# Patient Record
Sex: Male | Born: 2000 | Hispanic: Refuse to answer | Marital: Single | Smoking: Never smoker
Health system: Southern US, Community
[De-identification: ages and names within clinical notes are randomized; demographics above are authoritative.]

---
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
## 2019-05-03 ENCOUNTER — Emergency Department
Admission: EM | Admit: 2019-05-03 | Discharge: 2019-05-03 | Disposition: A | Discharge status: Home / Self Care | Payer: 59 | Attending: Emergency Medicine | Admitting: Emergency Medicine

## 2019-05-03 ENCOUNTER — Emergency Department: Payer: 59

## 2019-05-03 ENCOUNTER — Other Ambulatory Visit: Payer: Self-pay

## 2019-05-03 DIAGNOSIS — Y999 Unspecified external cause status: Secondary | ICD-10-CM | POA: Insufficient documentation

## 2019-05-03 DIAGNOSIS — S0990XA Unspecified injury of head, initial encounter: Secondary | ICD-10-CM | POA: Insufficient documentation

## 2019-05-03 DIAGNOSIS — W01198A Fall on same level from slipping, tripping and stumbling with subsequent striking against other object, initial encounter: Secondary | ICD-10-CM | POA: Diagnosis not present

## 2019-05-03 DIAGNOSIS — Y929 Unspecified place or not applicable: Secondary | ICD-10-CM | POA: Diagnosis not present

## 2019-05-03 DIAGNOSIS — Y9372 Activity, wrestling: Secondary | ICD-10-CM | POA: Diagnosis not present

## 2019-05-03 DIAGNOSIS — G44301 Post-traumatic headache, unspecified, intractable: Secondary | ICD-10-CM | POA: Insufficient documentation

## 2019-05-03 MED ORDER — EXCEDRIN MIGRAINE 250-250-65 MG PO TABS: 1.0000 | ORAL_TABLET | Freq: Four times a day (QID) | ORAL | 0 refills | Status: AC | PRN

## 2019-05-03 NOTE — ED Provider Notes (Signed)
[facility] Emergency Department Provider Note  ____________________________________________  Time seen: Approximately 5:51 PM  I have reviewed the triage vital signs and the nursing notes.   HISTORY  Chief Complaint Headache    HPI Timothy Morales is a 19 y.o. male who presents the emergency department complaining of posttraumatic headache, feeling dizzy.  Patient states that he was wrestling with some friends, calling me to the side of the head, then 1 to the forehead causing him to fall backwards and hitting his head on the floor.  No loss of consciousness but his patient states that he felt very dazed after the incident.  He reports that his friends state that he was confused, not making much sense for the first minute or 2 after head injury.  Since then patient feels like he is improved, however he does feel slightly off balance, slightly woozy, and does endorse a right temporal and frontal headache.  No vision changes.  No neck pain.  No chest pain, shortness of breath.  No nausea or vomiting.         History reviewed. No pertinent past medical history.  There are no problems to display for this patient.   History reviewed. No pertinent surgical history.  Prior to Admission medications   Medication Sig Start Date End Date Taking? Authorizing Provider  aspirin-acetaminophen-caffeine (EXCEDRIN MIGRAINE) [id number] MG tablet Take 1 tablet by mouth every 6 (six) hours as needed for headache. 05/03/19   Sidonie Dexheimer, Delorise Royals, PA-C    Allergies Patient has no allergy information on record.  No family history on file.  Social History Social History   Tobacco Use  . Smoking status: Never Smoker  . Smokeless tobacco: Never Used  Substance Use Topics  . Alcohol use: Yes    Comment: social  . Drug use: Never     Review of Systems  Constitutional: No fever/chills Eyes: No visual changes. No discharge ENT: No upper respiratory  complaints. Cardiovascular: no chest pain. Respiratory: no cough. No SOB. Gastrointestinal: No abdominal pain.  No nausea, no vomiting.  No diarrhea.  No constipation. Musculoskeletal: Negative for musculoskeletal pain. Skin: Negative for rash, abrasions, lacerations, ecchymosis. Neurological: Head injury with headache to the right temporal and frontal region.  Denies focal weakness or numbness. 10-point ROS otherwise negative.  ____________________________________________   PHYSICAL EXAM:  VITAL SIGNS: ED Triage Vitals  Enc Vitals Group     BP 05/03/19 1731 (!) 149/79     Pulse Rate 05/03/19 1731 90     Resp 05/03/19 1731 16     Temp 05/03/19 1731 98.6 F (37 C)     Temp Source 05/03/19 1731 Oral     SpO2 05/03/19 1731 99 %     Weight 05/03/19 1734 160 lb (72.6 kg)     Height 05/03/19 1734 5\' 9"  (1.753 m)     Head Circumference --      Peak Flow --      Pain Score 05/03/19 1733 5     Pain Loc --      Pain Edu? --      Excl. in GC? --      Constitutional: Alert and oriented. Well appearing and in no acute distress. Eyes: Conjunctivae are normal. PERRL. EOMI. Head: No visible signs of trauma with ecchymosis, edema, abrasions or lacerations.  Patient is slightly tender to palpation in the right temporal region extending into the frontal skull.  No other tenderness to palpation over the osseous structures of the skull  or face.  No battle signs, raccoon eyes, serosanguineous fluid drainage from the ears or nares. ENT:      Ears:       Nose: No congestion/rhinnorhea.      Mouth/Throat: Mucous membranes are moist.  Neck: No stridor.  No cervical spine tenderness to palpation.  Cardiovascular: Normal rate, regular rhythm. Normal S1 and S2.  Good peripheral circulation. Respiratory: Normal respiratory effort without tachypnea or retractions. Lungs CTAB. Good air entry to the bases with no decreased or absent breath sounds. Musculoskeletal: Full range of motion to all extremities.  No gross deformities appreciated. Neurologic:  Normal speech and language. No gross focal neurologic deficits are appreciated.  Cranial nerves II through XII grossly intact.  Negative Romberg's and pronator drift. Skin:  Skin is warm, dry and intact. No rash noted. Psychiatric: Mood and affect are normal. Speech and behavior are normal. Patient exhibits appropriate insight and judgement.   ____________________________________________   LABS (all labs ordered are listed, but only abnormal results are displayed)  Labs Reviewed - No data to display ____________________________________________  EKG   ____________________________________________  RADIOLOGY I personally viewed and evaluated these images as part of my medical decision making, as well as reviewing the written report by the radiologist.  I concur with radiologist finding of no acute intracranial or osseous abnormality of about the head or neck.  CT Head Wo Contrast  Result Date: 05/03/2019 CLINICAL DATA:  Acute pain due to trauma. Pain in the right temporal region. EXAM: CT HEAD WITHOUT CONTRAST CT CERVICAL SPINE WITHOUT CONTRAST TECHNIQUE: Multidetector CT imaging of the head and cervical spine was performed following the standard protocol without intravenous contrast. Multiplanar CT image reconstructions of the cervical spine were also generated. COMPARISON:  None. FINDINGS: CT HEAD FINDINGS Brain: No evidence of acute infarction, hemorrhage, hydrocephalus, extra-axial collection or mass lesion/mass effect. Vascular: No hyperdense vessel or unexpected calcification. Skull: Normal. Negative for fracture or focal lesion. Sinuses/Orbits: There is some mucosal thickening of the ethmoid air cells. Other: None. CT CERVICAL SPINE FINDINGS Alignment: Normal. Skull base and vertebrae: No acute fracture. No primary bone lesion or focal pathologic process. Soft tissues and spinal canal: No prevertebral fluid or swelling. No visible canal  hematoma. Disc levels: The disc heights are relatively well preserved throughout. Upper chest: Negative. Other: IMPRESSION: 1. No acute intracranial abnormality. 2. No acute fracture or malalignment of the cervical spine. 3. Mucosal thickening of the ethmoid air cells. Electronically Signed   By: Constance Holster M.D.   On: 05/03/2019 19:01   CT Cervical Spine Wo Contrast  Result Date: 05/03/2019 CLINICAL DATA:  Acute pain due to trauma. Pain in the right temporal region. EXAM: CT HEAD WITHOUT CONTRAST CT CERVICAL SPINE WITHOUT CONTRAST TECHNIQUE: Multidetector CT imaging of the head and cervical spine was performed following the standard protocol without intravenous contrast. Multiplanar CT image reconstructions of the cervical spine were also generated. COMPARISON:  None. FINDINGS: CT HEAD FINDINGS Brain: No evidence of acute infarction, hemorrhage, hydrocephalus, extra-axial collection or mass lesion/mass effect. Vascular: No hyperdense vessel or unexpected calcification. Skull: Normal. Negative for fracture or focal lesion. Sinuses/Orbits: There is some mucosal thickening of the ethmoid air cells. Other: None. CT CERVICAL SPINE FINDINGS Alignment: Normal. Skull base and vertebrae: No acute fracture. No primary bone lesion or focal pathologic process. Soft tissues and spinal canal: No prevertebral fluid or swelling. No visible canal hematoma. Disc levels: The disc heights are relatively well preserved throughout. Upper chest: Negative. Other: IMPRESSION: 1. No  acute intracranial abnormality. 2. No acute fracture or malalignment of the cervical spine. 3. Mucosal thickening of the ethmoid air cells. Electronically Signed   By: Katherine Mantle M.D.   On: 05/03/2019 19:01    ____________________________________________    PROCEDURES  Procedure(s) performed:    Procedures    Medications - No data to display   ____________________________________________   INITIAL IMPRESSION / ASSESSMENT  AND PLAN / ED COURSE  Pertinent labs & imaging results that were available during my care of the patient were reviewed by me and considered in my medical decision making (see chart for details).  Review of the [place] CSRS was performed in accordance of the [facility] prior to dispensing any controlled drugs.           Patient's diagnosis is consistent with head injury.  Patient presented to emergency department after wrestling with some friends, being kneed in the head twice.  Patient did not lose consciousness.  Overall exam was reassuring with patient being neurologically intact.  Giving location, multiple impacts to the head, the fact the patient was having headache and dizziness imaging was pursued.  CT scans revealed no acute intracranial osseous abnormality.  I have discussed concussion symptoms with the patient.  At this time I feel that symptoms are most likely post traumatic headache versus true concussion but this will be further determined by ongoing symptoms, recurrent headaches, memory issues.  If patient does experience the symptoms follow-up with neurology.  Otherwise patient may manage any headache with prescription for Excedrin..  I discussed postconcussive symptoms, recommendations.  As long as symptoms improve patient may return to activity as tolerated.  Otherwise the patient is still symptomatic he should refrain from any chance of repeat head injury.  Follow-up with primary care as needed.  Patient is given ED precautions to return to the ED for any worsening or new symptoms.     ____________________________________________  FINAL CLINICAL IMPRESSION(S) / ED DIAGNOSES  Final diagnoses:  Injury of head, initial encounter      NEW MEDICATIONS STARTED DURING THIS VISIT:  ED Discharge Orders         Ordered    aspirin-acetaminophen-caffeine (EXCEDRIN MIGRAINE) 250-250-65 MG tablet  Every 6 hours PRN     05/03/19 1949              This chart was dictated using voice  recognition software/Dragon. Despite best efforts to proofread, errors can occur which can change the meaning. Any change was purely unintentional.    Racheal Patches, PA-C 05/03/19 1951    Sharman Cheek, MD 05/04/19 0005

## 2019-05-03 NOTE — ED Notes (Signed)
See triage note  Presents with headache  States he was wrestling and was kneed in right temporal area   And then again in frontal area  No LOC

## 2019-05-03 NOTE — ED Triage Notes (Signed)
Pt to the er for possible concussion. Pt states he was wrestling and caught a knee to the right temple and then one to the forehead. Pt states he never lost consciousness but did feel dizzy and took him a few minutes to return to normal. Pt is no distress at this time. Pt reports a headache.

## 2020-12-03 IMAGING — CT CT CERVICAL SPINE W/O CM
3 of 4 series · 9 of 33 positions shown, 11 images · non-contrast
Comparison: None.

CLINICAL DATA: Acute pain due to trauma. Pain in the right temporal
region.

EXAM:
CT HEAD WITHOUT CONTRAST
CT CERVICAL SPINE WITHOUT CONTRAST
TECHNIQUE: Multidetector CT imaging of the head and cervical spine was
performed following the standard protocol without intravenous
contrast. Multiplanar CT image reconstructions of the cervical spine
were also generated.

[Series 6: sagittal bone · sagittal · 0.22mm/px · 5 of 57 slices shown, 6 images]
[im 19/57  bone]
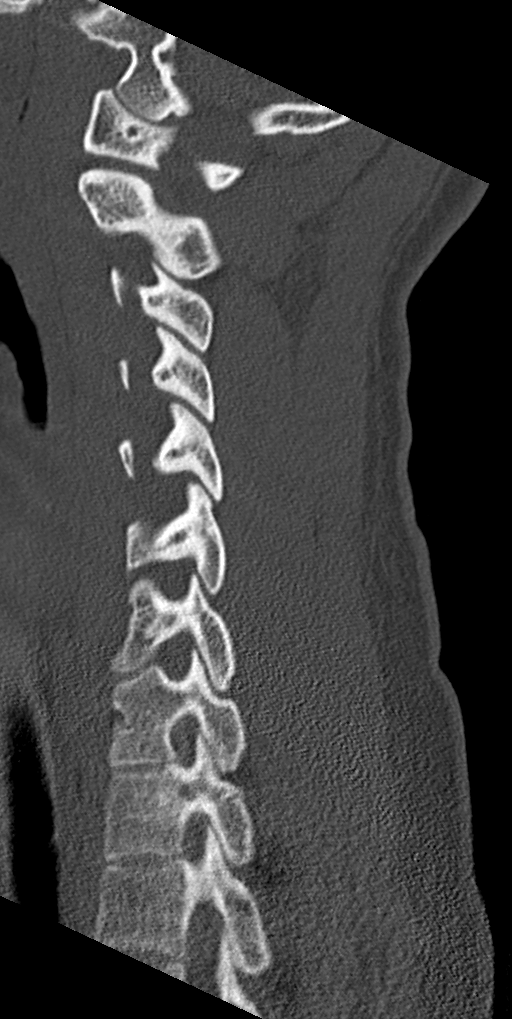
[im 24/57  bone]
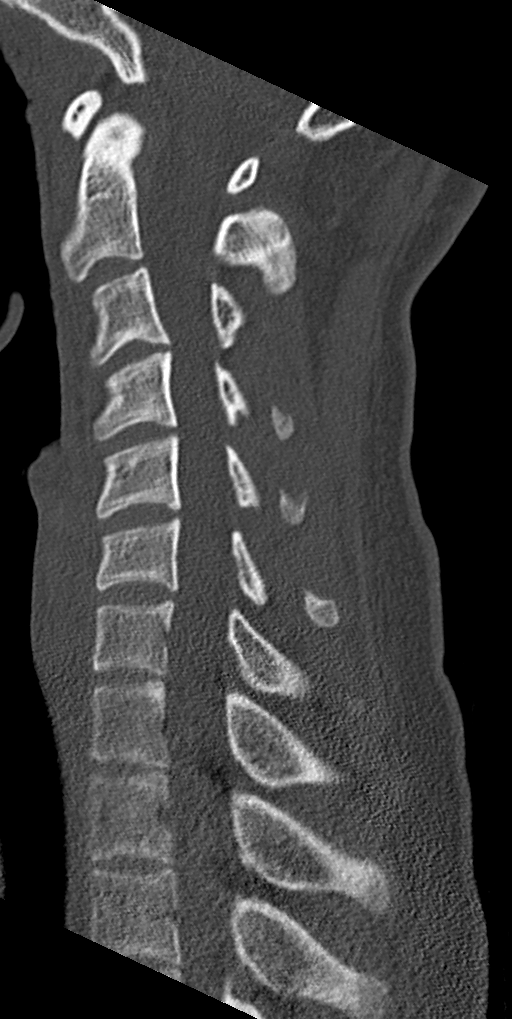
[im 29/57  soft-tissue]
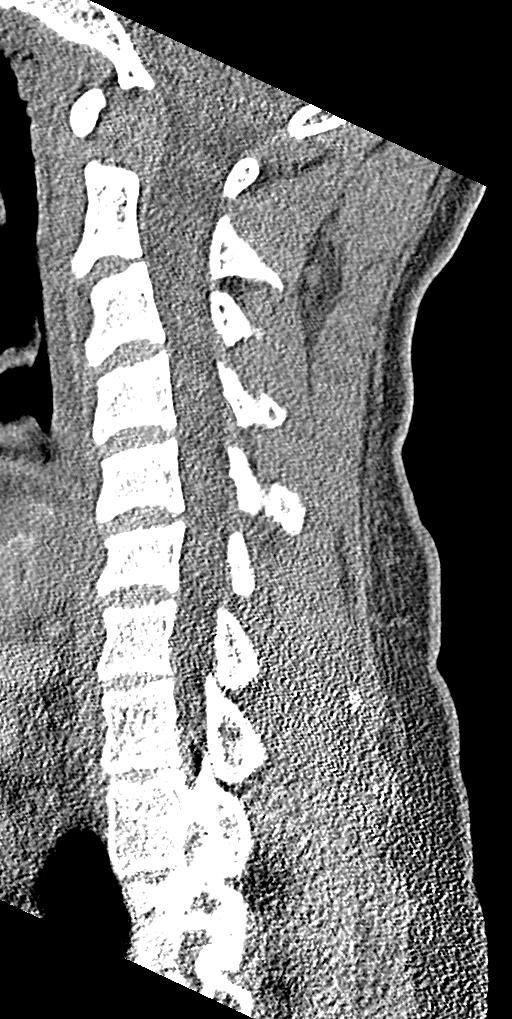
[im 29/57  bone]
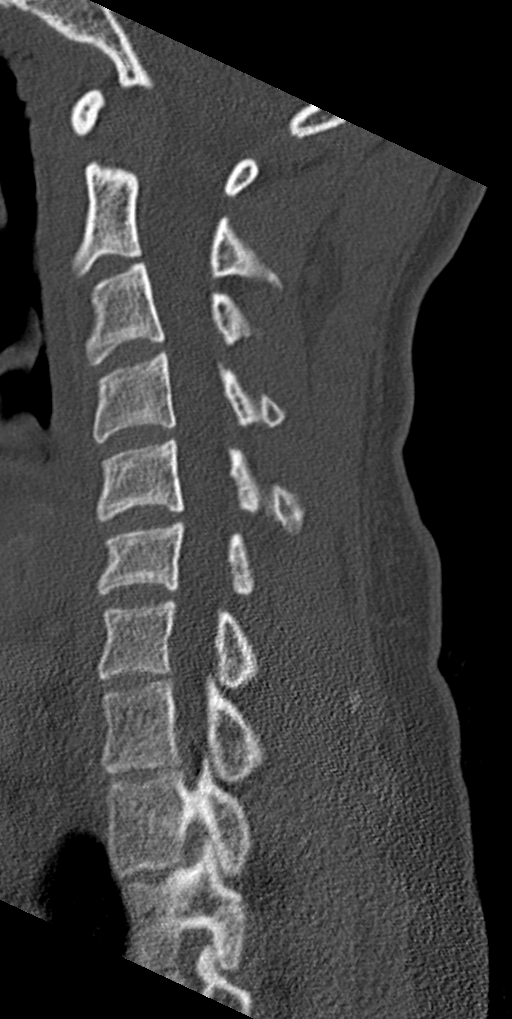
[im 33/57  bone]
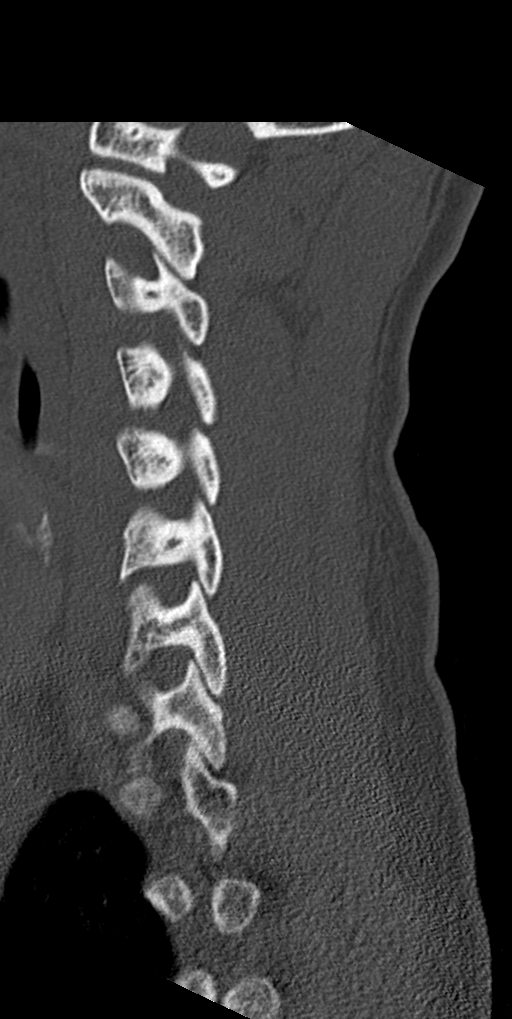
[im 38/57  bone]
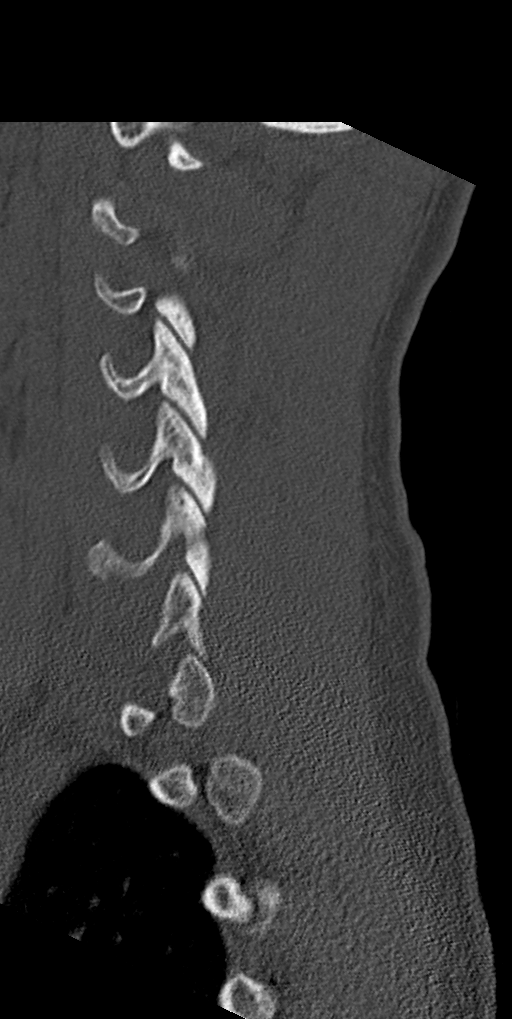

[Series 7: coronal bone · coronal · 0.22mm/px · 3 of 56 slices shown]
[im 12/56  bone]
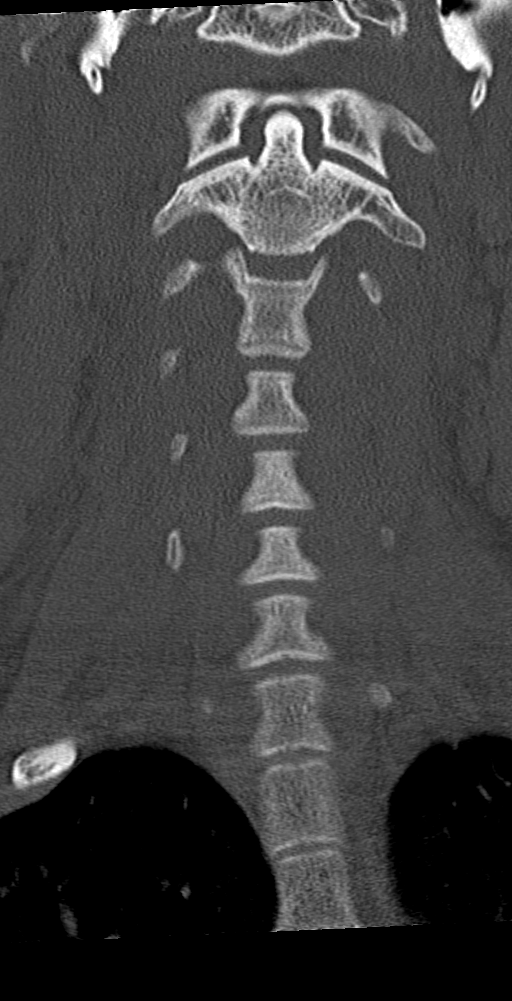
[im 23/56  bone]
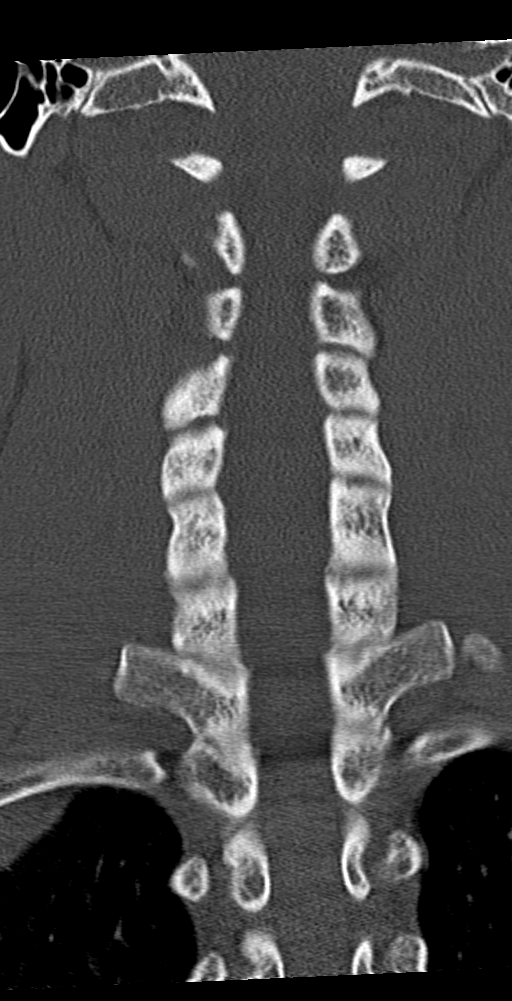
[im 34/56  bone]
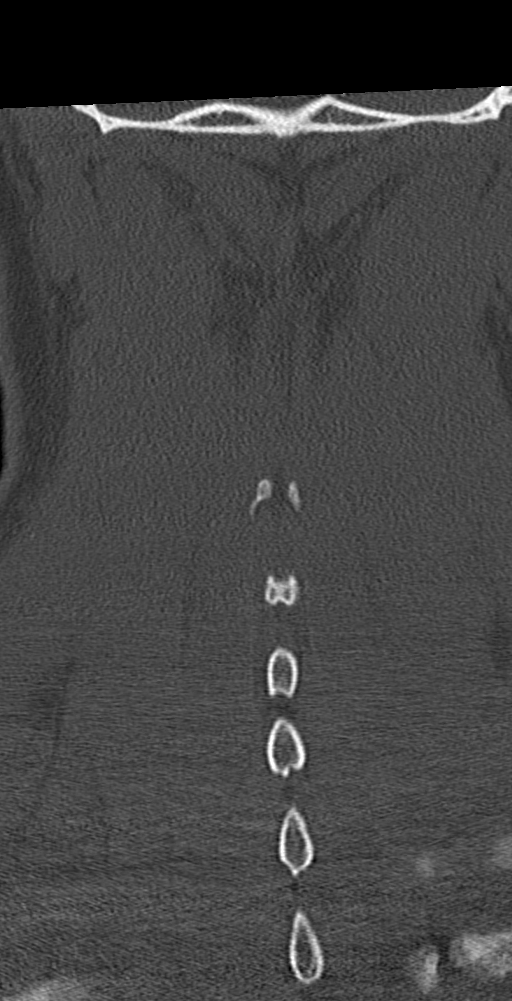

[Series 8: orthogonal bone · axial · 0.22mm/px · z∈[-223,-223]mm · 1 of 111 slices shown, 2 images]
[im 63/111  soft-tissue]
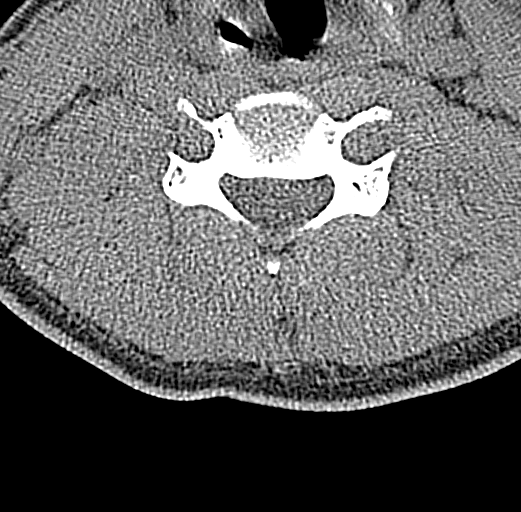
[im 63/111  bone]
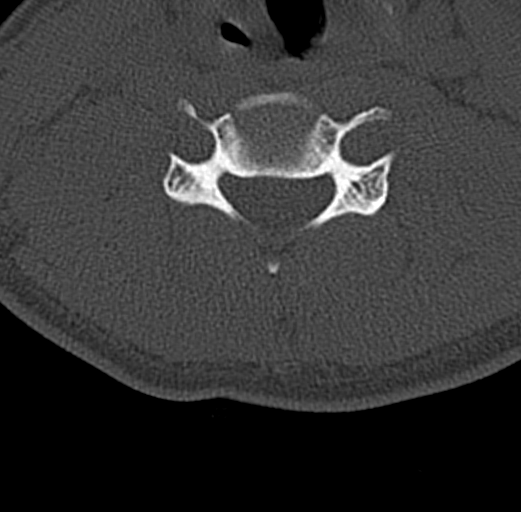

[9 of 33 positions shown; findings below may reference images not displayed]

FINDINGS: CT HEAD FINDINGS

Brain: No evidence of acute infarction, hemorrhage, hydrocephalus,
extra-axial collection or mass lesion/mass effect.

Vascular: No hyperdense vessel or unexpected calcification.

Skull: Normal. Negative for fracture or focal lesion.

Sinuses/Orbits: There is some mucosal thickening of the ethmoid air
cells.

Other: None.

CT CERVICAL SPINE FINDINGS

Alignment: Normal.

Skull base and vertebrae: No acute fracture. No primary bone lesion
or focal pathologic process.

Soft tissues and spinal canal: No prevertebral fluid or swelling. No
visible canal hematoma.

Disc levels: The disc heights are relatively well preserved
throughout.

Upper chest: Negative.

Other:
IMPRESSION: 1. No acute intracranial abnormality.
2. No acute fracture or malalignment of the cervical spine.
3. Mucosal thickening of the ethmoid air cells.
